# Patient Record
Sex: Female | Born: 1975 | Race: Black or African American | Hispanic: No | Marital: Single | Smoking: Never smoker
Health system: Southern US, Community
[De-identification: ages and names within clinical notes are randomized; demographics above are authoritative.]

## PROBLEM LIST (undated history)

## (undated) DIAGNOSIS — E079 Disorder of thyroid, unspecified: Secondary | ICD-10-CM

---

## 2009-07-06 ENCOUNTER — Emergency Department (HOSPITAL_BASED_OUTPATIENT_CLINIC_OR_DEPARTMENT_OTHER): Admission: EM | Admit: 2009-07-06 | Discharge: 2009-07-06 | Payer: Self-pay | Admitting: Emergency Medicine

## 2009-10-26 ENCOUNTER — Emergency Department (HOSPITAL_BASED_OUTPATIENT_CLINIC_OR_DEPARTMENT_OTHER): Admission: EM | Admit: 2009-10-26 | Discharge: 2009-10-26 | Payer: Self-pay | Admitting: Emergency Medicine

## 2010-04-01 LAB — DIFFERENTIAL
Lymphocytes Relative: 24 % (ref 12–46)
Monocytes Absolute: 0.4 10*3/uL (ref 0.1–1.0)
Monocytes Relative: 6 % (ref 3–12)
Neutro Abs: 4 10*3/uL (ref 1.7–7.7)

## 2010-04-01 LAB — CBC
MCH: 27.6 pg (ref 26.0–34.0)
MCHC: 32.7 g/dL (ref 30.0–36.0)
MCV: 84.4 fL (ref 78.0–100.0)
Platelets: 289 10*3/uL (ref 150–400)
RDW: 13.9 % (ref 11.5–15.5)
WBC: 6.1 10*3/uL (ref 4.0–10.5)

## 2010-04-01 LAB — URINALYSIS, ROUTINE W REFLEX MICROSCOPIC
Hgb urine dipstick: NEGATIVE
Protein, ur: NEGATIVE mg/dL
Urobilinogen, UA: 0.2 mg/dL (ref 0.0–1.0)

## 2010-04-01 LAB — COMPREHENSIVE METABOLIC PANEL
Albumin: 4 g/dL (ref 3.5–5.2)
BUN: 8 mg/dL (ref 6–23)
Calcium: 9.3 mg/dL (ref 8.4–10.5)
Creatinine, Ser: 0.7 mg/dL (ref 0.4–1.2)
Total Protein: 8.7 g/dL — ABNORMAL HIGH (ref 6.0–8.3)

## 2012-04-13 ENCOUNTER — Other Ambulatory Visit (HOSPITAL_COMMUNITY): Payer: Self-pay | Admitting: Internal Medicine

## 2012-04-13 DIAGNOSIS — C73 Malignant neoplasm of thyroid gland: Secondary | ICD-10-CM

## 2012-04-27 ENCOUNTER — Encounter (HOSPITAL_COMMUNITY)
Admission: RE | Admit: 2012-04-27 | Discharge: 2012-04-27 | Disposition: A | Discharge status: Home / Self Care | Payer: Medicaid Other | Source: Ambulatory Visit | Attending: Internal Medicine | Admitting: Internal Medicine

## 2012-04-27 DIAGNOSIS — C73 Malignant neoplasm of thyroid gland: Secondary | ICD-10-CM

## 2012-04-27 MED ORDER — THYROTROPIN ALFA 1.1 MG IM SOLR
0.9000 mg | INTRAMUSCULAR | Status: DC
  Administered 2012-04-27: 0.9 mg via INTRAMUSCULAR

## 2012-04-28 ENCOUNTER — Encounter (HOSPITAL_COMMUNITY): Payer: Medicaid Other | Attending: Internal Medicine

## 2012-04-28 DIAGNOSIS — C73 Malignant neoplasm of thyroid gland: Secondary | ICD-10-CM | POA: Insufficient documentation

## 2012-04-28 MED ORDER — THYROTROPIN ALFA 1.1 MG IM SOLR
0.9000 mg | INTRAMUSCULAR | Status: AC
  Administered 2012-04-28: 0.9 mg via INTRAMUSCULAR

## 2012-04-29 ENCOUNTER — Encounter (HOSPITAL_COMMUNITY)
Admission: RE | Admit: 2012-04-29 | Discharge: 2012-04-29 | Disposition: A | Discharge status: Home / Self Care | Payer: Medicaid Other | Source: Ambulatory Visit | Attending: Internal Medicine | Admitting: Internal Medicine

## 2012-04-29 DIAGNOSIS — C73 Malignant neoplasm of thyroid gland: Secondary | ICD-10-CM | POA: Insufficient documentation

## 2012-05-08 ENCOUNTER — Encounter (HOSPITAL_COMMUNITY)
Admission: RE | Admit: 2012-05-08 | Discharge: 2012-05-08 | Disposition: A | Discharge status: Home / Self Care | Payer: Medicaid Other | Source: Ambulatory Visit | Attending: Internal Medicine | Admitting: Internal Medicine

## 2012-05-08 DIAGNOSIS — C73 Malignant neoplasm of thyroid gland: Secondary | ICD-10-CM | POA: Insufficient documentation

## 2013-05-18 ENCOUNTER — Other Ambulatory Visit (HOSPITAL_COMMUNITY): Payer: Self-pay | Admitting: Internal Medicine

## 2013-05-18 DIAGNOSIS — C73 Malignant neoplasm of thyroid gland: Secondary | ICD-10-CM

## 2013-05-31 ENCOUNTER — Encounter (HOSPITAL_COMMUNITY): Admission: RE | Admit: 2013-05-31 | Payer: Medicaid Other | Source: Ambulatory Visit

## 2013-05-31 ENCOUNTER — Encounter (HOSPITAL_COMMUNITY)
Admission: RE | Admit: 2013-05-31 | Discharge: 2013-05-31 | Disposition: A | Discharge status: Home / Self Care | Payer: Medicaid Other | Source: Ambulatory Visit | Attending: Internal Medicine | Admitting: Internal Medicine

## 2013-05-31 DIAGNOSIS — C73 Malignant neoplasm of thyroid gland: Secondary | ICD-10-CM

## 2013-05-31 MED ORDER — THYROTROPIN ALFA 1.1 MG IM SOLR
0.9000 mg | INTRAMUSCULAR | Status: AC
  Administered 2013-05-31: 0.9 mg via INTRAMUSCULAR
  Filled 2013-05-31: qty 0.9

## 2013-06-01 ENCOUNTER — Encounter (HOSPITAL_COMMUNITY)
Admission: RE | Admit: 2013-06-01 | Discharge: 2013-06-01 | Disposition: A | Discharge status: Home / Self Care | Payer: Medicaid Other | Source: Ambulatory Visit | Attending: Internal Medicine | Admitting: Internal Medicine

## 2013-06-01 ENCOUNTER — Encounter (HOSPITAL_COMMUNITY): Payer: Medicaid Other

## 2013-06-01 DIAGNOSIS — C73 Malignant neoplasm of thyroid gland: Secondary | ICD-10-CM | POA: Insufficient documentation

## 2013-06-01 MED ORDER — THYROTROPIN ALFA 1.1 MG IM SOLR
0.9000 mg | INTRAMUSCULAR | Status: AC
  Administered 2013-06-01: 0.9 mg via INTRAMUSCULAR
  Filled 2013-06-01: qty 0.9

## 2013-06-02 ENCOUNTER — Encounter (HOSPITAL_COMMUNITY)
Admission: RE | Admit: 2013-06-02 | Discharge: 2013-06-02 | Disposition: A | Discharge status: Home / Self Care | Payer: Medicaid Other | Source: Ambulatory Visit | Attending: Internal Medicine | Admitting: Internal Medicine

## 2013-06-02 ENCOUNTER — Encounter (HOSPITAL_COMMUNITY): Payer: Medicaid Other

## 2013-06-02 LAB — HCG, SERUM, QUALITATIVE: Preg, Serum: NEGATIVE

## 2013-06-04 ENCOUNTER — Encounter (HOSPITAL_COMMUNITY): Payer: Medicaid Other

## 2013-06-04 ENCOUNTER — Encounter (HOSPITAL_COMMUNITY)
Admission: RE | Admit: 2013-06-04 | Discharge: 2013-06-04 | Disposition: A | Discharge status: Home / Self Care | Payer: Medicaid Other | Source: Ambulatory Visit | Attending: Internal Medicine | Admitting: Internal Medicine

## 2013-06-04 MED ORDER — SODIUM IODIDE I 131 CAPSULE
4.2000 | Freq: Once | INTRAVENOUS | Status: AC | PRN
  Administered 2013-06-04: 4.2 via ORAL

## 2013-06-08 LAB — THYROGLOBULIN LEVEL: Thyroglobulin: 1 ng/mL (ref 0.0–55.0)

## 2013-06-08 LAB — THYROGLOBULIN ANTIBODY: Thyroglobulin Ab: <20 IU/mL (ref <40.0)

## 2013-06-21 ENCOUNTER — Ambulatory Visit (HOSPITAL_COMMUNITY): Payer: Medicaid Other

## 2013-06-22 ENCOUNTER — Ambulatory Visit (HOSPITAL_COMMUNITY): Payer: Medicaid Other

## 2013-06-23 ENCOUNTER — Ambulatory Visit (HOSPITAL_COMMUNITY): Payer: Medicaid Other

## 2013-06-25 ENCOUNTER — Encounter (HOSPITAL_COMMUNITY): Payer: Medicaid Other

## 2022-07-21 ENCOUNTER — Ambulatory Visit
Admission: EM | Admit: 2022-07-21 | Discharge: 2022-07-21 | Disposition: A | Discharge status: Home / Self Care | Payer: Medicaid Other | Attending: Family Medicine | Admitting: Family Medicine

## 2022-07-21 ENCOUNTER — Encounter: Payer: Self-pay | Admitting: Emergency Medicine

## 2022-07-21 DIAGNOSIS — R5383 Other fatigue: Secondary | ICD-10-CM | POA: Diagnosis not present

## 2022-07-21 DIAGNOSIS — R110 Nausea: Secondary | ICD-10-CM | POA: Diagnosis not present

## 2022-07-21 DIAGNOSIS — E039 Hypothyroidism, unspecified: Secondary | ICD-10-CM | POA: Diagnosis not present

## 2022-07-21 HISTORY — DX: Disorder of thyroid, unspecified: E07.9

## 2022-07-21 MED ORDER — ONDANSETRON HCL 8 MG PO TABS: 8.0000 mg | ORAL_TABLET | Freq: Three times a day (TID) | ORAL | 0 refills | Status: DC | PRN

## 2022-07-21 MED ORDER — ONDANSETRON 4 MG PO TBDP
4.0000 mg | ORAL_TABLET | Freq: Once | ORAL | Status: AC
  Administered 2022-07-21: 4 mg via ORAL

## 2022-07-21 MED ORDER — PROMETHAZINE HCL 25 MG PO TABS: 25.0000 mg | ORAL_TABLET | Freq: Four times a day (QID) | ORAL | 0 refills | Status: AC | PRN

## 2022-07-21 NOTE — ED Provider Notes (Signed)
[facility]    CSN: [id number] Arrival date & time: 07/21/22  1237      History   Chief Complaint Chief Complaint  Patient presents with   Fatigue    HPI Shelby Atkins is a 47 y.o. female.   HPI  Patient has a history of hypothyroidism.  She takes Synthroid 125 mcg a day.  She states that she usually sees her endocrinologist once a year.  She states she has been under a lot of stress.  She does not have a car and has been having to do more walking.  She states that her weight has been going up and down.  She has had nausea for the last 3 days.  She is here today hoping for management of her nausea.  She is going to call her endocrinologist tomorrow to see about an appointment for her thyroid.  She states that sometimes takes time to get an appointment.  I offered to draw blood work so she believes to have an idea if thyroid problems are causing her fatigue Last menstrual period was less than a month ago.  Patient states she is not pregnant Past Medical History:  Diagnosis Date   Thyroid disease     There are no problems to display for this patient.   History reviewed. No pertinent surgical history.  OB History   No obstetric history on file.      Home Medications    Prior to Admission medications   Medication Sig Start Date End Date Taking? Authorizing Provider  levothyroxine (SYNTHROID) 125 MCG tablet Take 125 mcg by mouth daily before breakfast. 12/24/21  Yes [provider]  metoprolol tartrate (LOPRESSOR) 25 MG tablet Take 25 mg by mouth 2 (two) times daily. 12/03/21  Yes [provider]  ondansetron (ZOFRAN) 8 MG tablet Take 1 tablet (8 mg total) by mouth every 8 (eight) hours as needed. 07/21/22  Yes Eustace Moore, MD    Family History History reviewed. No pertinent family history.  Social History Social History   Tobacco Use   Smoking status: Never   Smokeless tobacco: Never  Vaping Use   Vaping Use: Never used   Substance Use Topics   Drug use: Never     Allergies   Patient has no known allergies.   Review of Systems Review of Systems See HPI  Physical Exam Triage Vital Signs ED Triage Vitals  Enc Vitals Group     BP 07/21/22 1251 (!) 165/97     Pulse Rate 07/21/22 1251 (!) 121     Resp 07/21/22 1251 18     Temp 07/21/22 1251 98.4 F (36.9 C)     Temp Source 07/21/22 1251 Oral     SpO2 07/21/22 1251 98 %     Weight 07/21/22 1252 189 lb 5 oz (85.9 kg)     Height 07/21/22 1252 5' 4.5" (1.638 m)     Head Circumference --      Peak Flow --      Pain Score 07/21/22 1252 0     Pain Loc --      Pain Edu? --      Excl. in GC? --    No data found.  Updated Vital Signs BP (!) 165/97 (BP Location: Right Arm)   Pulse (!) 121   Temp 98.4 F (36.9 C) (Oral)   Resp 18   Ht 5' 4.5" (1.638 m)   Wt 85.9 kg   LMP 06/23/2022 (Exact Date)  SpO2 98%   BMI 31.99 kg/m      Physical Exam Constitutional:      General: She is not in acute distress.    Appearance: She is well-developed.     Comments: Appears anxious about health  HENT:     Head: Normocephalic and atraumatic.  Eyes:     Conjunctiva/sclera: Conjunctivae normal.     Pupils: Pupils are equal, round, and reactive to light.  Cardiovascular:     Rate and Rhythm: Normal rate.  Pulmonary:     Effort: Pulmonary effort is normal. No respiratory distress.  Abdominal:     General: There is no distension.     Palpations: Abdomen is soft.  Musculoskeletal:        General: Normal range of motion.     Cervical back: Normal range of motion.  Skin:    General: Skin is warm and dry.  Neurological:     Mental Status: She is alert.     Deep Tendon Reflexes: Reflexes normal.      UC Treatments / Results  Labs (all labs ordered are listed, but only abnormal results are displayed) Labs Reviewed  T4, FREE  TSH    EKG   Radiology No results found.  Procedures Procedures (including critical care time)  Medications  Ordered in UC Medications  ondansetron (ZOFRAN-ODT) disintegrating tablet 4 mg (4 mg Oral Given 07/21/22 1256)    Initial Impression / Assessment and Plan / UC Course  I have reviewed the triage vital signs and the nursing notes.  Pertinent labs & imaging results that were available during my care of the patient were reviewed by me and considered in my medical decision making (see chart for details).     Final Clinical Impressions(s) / UC Diagnoses   Final diagnoses:  Hypothyroidism, unspecified type  Other fatigue  Nausea without vomiting     Discharge Instructions      Take the zofran as needed nausea The thyroid tests will be available on my chart tomorrow Follow with your endocrinologist    ED Prescriptions     Medication Sig Dispense Auth. Provider   ondansetron (ZOFRAN) 8 MG tablet Take 1 tablet (8 mg total) by mouth every 8 (eight) hours as needed. 20 tablet Eustace Moore, MD      PDMP not reviewed this encounter.   Eustace Moore, MD 07/21/22 1320

## 2022-07-21 NOTE — ED Triage Notes (Signed)
Patient c/o feeling fatigue and nausea x 3 days.  Nausea is worse today.  History of thyroid issues and has an appt w/her physician tomorrow @ 3:30pm.  Denies any nausea meds.

## 2022-07-21 NOTE — Discharge Instructions (Signed)
Take the zofran as needed nausea The thyroid tests will be available on my chart tomorrow Follow with your endocrinologist

## 2022-07-23 LAB — T4, FREE: Free T4: 1.43 ng/dL (ref 0.82–1.77)

## 2022-07-23 LAB — TSH: TSH: 1.12 u[IU]/mL (ref 0.450–4.500)

## 2022-08-31 ENCOUNTER — Encounter: Payer: Self-pay | Admitting: Emergency Medicine

## 2022-08-31 ENCOUNTER — Ambulatory Visit
Admission: EM | Admit: 2022-08-31 | Discharge: 2022-08-31 | Disposition: A | Discharge status: Home / Self Care | Payer: Medicaid Other | Attending: Family Medicine | Admitting: Family Medicine

## 2022-08-31 DIAGNOSIS — U071 COVID-19: Secondary | ICD-10-CM | POA: Diagnosis not present

## 2022-08-31 LAB — POC SARS CORONAVIRUS 2 AG -  ED: SARS Coronavirus 2 Ag: POSITIVE — AB

## 2022-08-31 NOTE — ED Triage Notes (Signed)
Patient c/o cough, body chills, congestion, nasal drainage, sneezing x 3 days.  Sx's are worse at night.  At home COVID test was negative on Wednesday.  Patient has taken Mucinex and Nyquil.

## 2022-08-31 NOTE — ED Provider Notes (Signed)
[facility]    CSN: [id number] Arrival date & time: 08/31/22  0805      History   Chief Complaint Chief Complaint  Patient presents with   Sore Throat    HPI Shelby Atkins is a 47 y.o. female.   Patient started with symptoms on Wednesday.  She has cough and cold symptoms.  She also has had bodyaches and headache.  She had shaking chills but she has not had a fever.  Decreased appetite.  She is taking over-the-counter medicines.  She states that she is not improving.  She did a COVID test when her symptoms started on Wednesday, and it was negative at home.  No one else at home is sick    Past Medical History:  Diagnosis Date   Thyroid disease     There are no problems to display for this patient.   History reviewed. No pertinent surgical history.  OB History   No obstetric history on file.      Home Medications    Prior to Admission medications   Medication Sig Start Date End Date Taking? Authorizing Provider  levothyroxine (SYNTHROID) 125 MCG tablet Take 125 mcg by mouth daily before breakfast. 12/24/21  Yes [provider]  metoprolol tartrate (LOPRESSOR) 25 MG tablet Take 25 mg by mouth 2 (two) times daily. 12/03/21  Yes [provider]  promethazine (PHENERGAN) 25 MG tablet Take 1 tablet (25 mg total) by mouth every 6 (six) hours as needed for nausea or vomiting. 07/21/22   Eustace Moore, MD    Family History History reviewed. No pertinent family history.  Social History Social History   Tobacco Use   Smoking status: Never   Smokeless tobacco: Never  Vaping Use   Vaping status: Never Used  Substance Use Topics   Alcohol use: Never   Drug use: Never     Allergies   Patient has no known allergies.   Review of Systems Review of Systems See HPI  Physical Exam Triage Vital Signs ED Triage Vitals  Encounter Vitals Group     BP 08/31/22 0818 (!) 152/93     Systolic BP Percentile --      Diastolic BP Percentile  --      Pulse Rate 08/31/22 0818 76     Resp 08/31/22 0818 18     Temp 08/31/22 0818 98.1 F (36.7 C)     Temp Source 08/31/22 0818 Oral     SpO2 08/31/22 0818 98 %     Weight 08/31/22 0820 190 lb (86.2 kg)     Height 08/31/22 0820 5\' 4"  (1.626 m)     Head Circumference --      Peak Flow --      Pain Score 08/31/22 0820 7     Pain Loc --      Pain Education --      Exclude from Growth Chart --    No data found.  Updated Vital Signs BP (!) 152/93 (BP Location: Right Arm)   Pulse 76   Temp 98.1 F (36.7 C) (Oral)   Resp 18   Ht 5\' 4"  (1.626 m)   Wt 86.2 kg   LMP 07/29/2022   SpO2 98%   BMI 32.61 kg/m      Physical Exam Constitutional:      General: She is not in acute distress.    Appearance: She is well-developed. She is ill-appearing.  HENT:     Head: Normocephalic and atraumatic.  Right Ear: Tympanic membrane and ear canal normal.     Left Ear: Tympanic membrane and ear canal normal.     Nose: Congestion present. No rhinorrhea.     Mouth/Throat:     Pharynx: No posterior oropharyngeal erythema.  Eyes:     Conjunctiva/sclera: Conjunctivae normal.     Pupils: Pupils are equal, round, and reactive to light.  Cardiovascular:     Rate and Rhythm: Normal rate and regular rhythm.     Heart sounds: Normal heart sounds.  Pulmonary:     Effort: Pulmonary effort is normal. No respiratory distress.     Breath sounds: Rhonchi present.  Abdominal:     General: There is no distension.     Palpations: Abdomen is soft.  Musculoskeletal:        General: Normal range of motion.     Cervical back: Normal range of motion.  Lymphadenopathy:     Cervical: No cervical adenopathy.  Skin:    General: Skin is warm and dry.  Neurological:     Mental Status: She is alert.      UC Treatments / Results  Labs (all labs ordered are listed, but only abnormal results are displayed) Labs Reviewed  POC SARS CORONAVIRUS 2 AG -  ED - Abnormal; Notable for the following  components:      Result Value   SARS Coronavirus 2 Ag Positive (*)    All other components within normal limits    EKG   Radiology No results found.  Procedures Procedures (including critical care time)  Medications Ordered in UC Medications - No data to display  Initial Impression / Assessment and Plan / UC Course  I have reviewed the triage vital signs and the nursing notes.  Pertinent labs & imaging results that were available during my care of the patient were reviewed by me and considered in my medical decision making (see chart for details).     Discussed positive test.  Need for quarantine.  Return to work recommendations Final Clinical Impressions(s) / UC Diagnoses   Final diagnoses:  COVID-19     Discharge Instructions      Make sure you drink lots of water May take over-the-counter cough and cold medicine as needed Take Tylenol and ibuprofen for pain and fever Call for problems   ED Prescriptions   None    PDMP not reviewed this encounter.   Eustace Moore, MD 08/31/22 [phone]

## 2022-08-31 NOTE — Discharge Instructions (Signed)
Make sure you drink lots of water May take over-the-counter cough and cold medicine as needed Take Tylenol and ibuprofen for pain and fever Call for problems

## 2023-01-17 ENCOUNTER — Emergency Department (HOSPITAL_BASED_OUTPATIENT_CLINIC_OR_DEPARTMENT_OTHER): Payer: Medicaid Other

## 2023-01-17 ENCOUNTER — Other Ambulatory Visit: Payer: Self-pay

## 2023-01-17 ENCOUNTER — Encounter (HOSPITAL_BASED_OUTPATIENT_CLINIC_OR_DEPARTMENT_OTHER): Payer: Self-pay

## 2023-01-17 DIAGNOSIS — I10 Essential (primary) hypertension: Secondary | ICD-10-CM | POA: Insufficient documentation

## 2023-01-17 DIAGNOSIS — X58XXXA Exposure to other specified factors, initial encounter: Secondary | ICD-10-CM | POA: Diagnosis not present

## 2023-01-17 DIAGNOSIS — Z79899 Other long term (current) drug therapy: Secondary | ICD-10-CM | POA: Diagnosis not present

## 2023-01-17 DIAGNOSIS — S90421A Blister (nonthermal), right great toe, initial encounter: Secondary | ICD-10-CM | POA: Diagnosis present

## 2023-01-17 DIAGNOSIS — E119 Type 2 diabetes mellitus without complications: Secondary | ICD-10-CM | POA: Insufficient documentation

## 2023-01-17 DIAGNOSIS — Z7984 Long term (current) use of oral hypoglycemic drugs: Secondary | ICD-10-CM | POA: Diagnosis not present

## 2023-01-17 LAB — COMPREHENSIVE METABOLIC PANEL
ALT: 15 U/L (ref 0–44)
AST: 17 U/L (ref 15–41)
Albumin: 3.5 g/dL (ref 3.5–5.0)
Alkaline Phosphatase: 57 U/L (ref 38–126)
Anion gap: 6 (ref 5–15)
BUN: 14 mg/dL (ref 6–20)
CO2: 24 mmol/L (ref 22–32)
Calcium: 9.7 mg/dL (ref 8.9–10.3)
Chloride: 106 mmol/L (ref 98–111)
Creatinine, Ser: 0.72 mg/dL (ref 0.44–1.00)
GFR, Estimated: >60 mL/min (ref >60)
Glucose, Bld: 134 mg/dL — ABNORMAL HIGH (ref 70–99)
Potassium: 3.9 mmol/L (ref 3.5–5.1)
Sodium: 136 mmol/L (ref 135–145)
Total Bilirubin: 0.3 mg/dL (ref 0.0–1.2)
Total Protein: 7.6 g/dL (ref 6.5–8.1)

## 2023-01-17 LAB — CBC WITH DIFFERENTIAL/PLATELET
Abs Immature Granulocytes: 0.04 10*3/uL (ref 0.00–0.07)
Basophils Absolute: 0 10*3/uL (ref 0.0–0.1)
Basophils Relative: 1 %
Eosinophils Absolute: 0.2 10*3/uL (ref 0.0–0.5)
Eosinophils Relative: 4 %
HCT: 38.8 % (ref 36.0–46.0)
Hemoglobin: 12.7 g/dL (ref 12.0–15.0)
Immature Granulocytes: 1 %
Lymphocytes Relative: 19 %
Lymphs Abs: 1.1 10*3/uL (ref 0.7–4.0)
MCH: 29.5 pg (ref 26.0–34.0)
MCHC: 32.7 g/dL (ref 30.0–36.0)
MCV: 90 fL (ref 80.0–100.0)
Monocytes Absolute: 0.5 10*3/uL (ref 0.1–1.0)
Monocytes Relative: 8 %
Neutro Abs: 3.9 10*3/uL (ref 1.7–7.7)
Neutrophils Relative %: 67 %
Platelets: 271 10*3/uL (ref 150–400)
RBC: 4.31 MIL/uL (ref 3.87–5.11)
RDW: 13.2 % (ref 11.5–15.5)
WBC: 5.7 10*3/uL (ref 4.0–10.5)
nRBC: 0 % (ref 0.0–0.2)

## 2023-01-17 NOTE — ED Triage Notes (Addendum)
 Pt c/o right 1st toe pain - appears to have a blister on top but pain on bottom.  Pt is diabetic on Metformin.   Pt last took 800 mg Ibuprofen @ 1830.

## 2023-01-18 ENCOUNTER — Emergency Department (HOSPITAL_BASED_OUTPATIENT_CLINIC_OR_DEPARTMENT_OTHER)
Admission: EM | Admit: 2023-01-18 | Discharge: 2023-01-18 | Disposition: A | Discharge status: Home / Self Care | Payer: Medicaid Other | Attending: Emergency Medicine | Admitting: Emergency Medicine

## 2023-01-18 DIAGNOSIS — S90421A Blister (nonthermal), right great toe, initial encounter: Secondary | ICD-10-CM

## 2023-01-18 MED ORDER — DOXYCYCLINE HYCLATE 100 MG PO CAPS: 100.0000 mg | ORAL_CAPSULE | Freq: Two times a day (BID) | ORAL | 0 refills | Status: AC

## 2023-01-18 NOTE — ED Provider Notes (Signed)
 [place] [facility] Provider Note   CSN: [id number] Arrival date & time: 01/17/23  2229     History  Chief Complaint  Patient presents with   Toe Pain    Shelby Atkins is a 48 y.o. female.  The history is provided by the patient.  Toe Pain  Shelby Atkins is a 48 y.o. female who presents to the Emergency Department complaining of toe blister.  She presents to the emergency department for evaluation of blister to the top of her left toe.  She just noticed this blister.  She was seen at urgent care for pain in the base of her right foot.  Her foot pain started on the 31st but she went to urgent care on January 2.  She was treated with NSAIDs, which significantly improves her pain.  Her pain is completely resolved when she takes ibuprofen.  Today she noticed a blister on the top of her toe, which prompted evaluation.  No fever, chest pain, shortness of breath.  She does have a history of diabetes as well as high blood pressure.  She has been experiencing skin changes to her arms, legs and has been already seen by dermatology with a biopsy.  It was determined that her skin changes were secondary to taking amlodipine and that medication was discontinued.  Overall her rash is stable to improving.       Home Medications Prior to Admission medications   Medication Sig Start Date End Date Taking? Authorizing Provider  doxycycline  (VIBRAMYCIN ) 100 MG capsule Take 1 capsule (100 mg total) by mouth 2 (two) times daily. 01/18/23  Yes Griselda Norris, MD  levothyroxine (SYNTHROID) 125 MCG tablet Take 125 mcg by mouth daily before breakfast. 12/24/21   [provider]  metoprolol tartrate (LOPRESSOR) 25 MG tablet Take 25 mg by mouth 2 (two) times daily. 12/03/21   [provider]  promethazine  (PHENERGAN ) 25 MG tablet Take 1 tablet (25 mg total) by mouth every 6 (six) hours as needed for nausea or vomiting. 07/21/22   Maranda Jamee Jacob, MD       Allergies    Patient has no known allergies.    Review of Systems   Review of Systems  All other systems reviewed and are negative.   Physical Exam Updated Vital Signs BP (!) 148/81   Pulse 93   Temp 97.7 F (36.5 C) (Oral)   Resp 15   Ht 5' 4 (1.626 m)   Wt 89.4 kg   LMP 01/15/2023   SpO2 99%   BMI 33.81 kg/m  Physical Exam Vitals and nursing note reviewed.  Constitutional:      Appearance: She is well-developed.  HENT:     Head: Normocephalic and atraumatic.  Cardiovascular:     Rate and Rhythm: Normal rate and regular rhythm.     Heart sounds: No murmur heard. Pulmonary:     Effort: Pulmonary effort is normal. No respiratory distress.     Breath sounds: Normal breath sounds.  Abdominal:     Palpations: Abdomen is soft.     Tenderness: There is no abdominal tenderness. There is no guarding or rebound.  Musculoskeletal:        General: No tenderness.     Comments: 2+ DP pulses bilaterally.  There is an intact blister to the dorsal surface of the great toe without erythema or tenderness.  There are skin changes to the dorsal feet bilaterally with pink/sloughed skin (pt reports ongoing issue for  several weeks seen by derm).   Skin:    General: Skin is warm and dry.  Neurological:     Mental Status: She is alert and oriented to person, place, and time.  Psychiatric:        Behavior: Behavior normal.     ED Results / Procedures / Treatments   Labs (all labs ordered are listed, but only abnormal results are displayed) Labs Reviewed  COMPREHENSIVE METABOLIC PANEL - Abnormal; Notable for the following components:      Result Value   Glucose, Bld 134 (*)    All other components within normal limits  CBC WITH DIFFERENTIAL/PLATELET    EKG None  Radiology DG Toe Great Right Result Date: 01/17/2023 CLINICAL DATA:  Blister on first toe with pain, initial encounter EXAM: RIGHT GREAT TOE COMPARISON:  None Available. FINDINGS: There is no evidence of fracture or  dislocation. There is no evidence of arthropathy or other focal bone abnormality. Soft tissue swelling is noted along the dorsum of the interphalangeal joint consistent with the given clinical history. IMPRESSION: No acute bony abnormality noted. Electronically Signed   By: Oneil Devonshire M.D.   On: 01/17/2023 23:54    Procedures Procedures    Medications Ordered in ED Medications - No data to display  ED Course/ Medical Decision Making/ A&P                                 Medical Decision Making Amount and/or Complexity of Data Reviewed Labs: ordered. Radiology: ordered.  Risk Prescription drug management.   Patient with history of diabetes here for evaluation of skin changes, blister to the right great toe.  On examination she does have an intact vesicle to the toe that does not appear to be purulent.  There is no surrounding erythema.  She also has additional separate skin changes to the dorsal feet bilaterally that are consistent with a rash that is on other regions of her body.  Suspect that this is likely a friction blister, given her history of diabetes will start on antibiotics as there may be a developing infection.  Given this blister appears to be sterile at this time will not unroofed.  She is already scheduled to follow-up with podiatry in about a week.  Plan to discharge with outpatient follow-up and return precautions.        Final Clinical Impression(s) / ED Diagnoses Final diagnoses:  Blister of great toe of right foot, initial encounter    Rx / DC Orders ED Discharge Orders          Ordered    doxycycline  (VIBRAMYCIN ) 100 MG capsule  2 times daily        01/18/23 0256              Griselda Norris, MD 01/18/23 0600

## 2023-07-26 ENCOUNTER — Emergency Department (HOSPITAL_BASED_OUTPATIENT_CLINIC_OR_DEPARTMENT_OTHER)
Admission: EM | Admit: 2023-07-26 | Discharge: 2023-07-26 | Disposition: A | Discharge status: Home / Self Care | Attending: Emergency Medicine | Admitting: Emergency Medicine

## 2023-07-26 ENCOUNTER — Other Ambulatory Visit: Payer: Self-pay

## 2023-07-26 ENCOUNTER — Encounter (HOSPITAL_BASED_OUTPATIENT_CLINIC_OR_DEPARTMENT_OTHER): Payer: Self-pay | Admitting: Emergency Medicine

## 2023-07-26 DIAGNOSIS — R0602 Shortness of breath: Secondary | ICD-10-CM | POA: Diagnosis present

## 2023-07-26 DIAGNOSIS — R059 Cough, unspecified: Secondary | ICD-10-CM | POA: Insufficient documentation

## 2023-07-26 DIAGNOSIS — J40 Bronchitis, not specified as acute or chronic: Secondary | ICD-10-CM | POA: Diagnosis not present

## 2023-07-26 DIAGNOSIS — E039 Hypothyroidism, unspecified: Secondary | ICD-10-CM | POA: Diagnosis not present

## 2023-07-26 DIAGNOSIS — I10 Essential (primary) hypertension: Secondary | ICD-10-CM | POA: Insufficient documentation

## 2023-07-26 DIAGNOSIS — Z79899 Other long term (current) drug therapy: Secondary | ICD-10-CM | POA: Insufficient documentation

## 2023-07-26 LAB — BASIC METABOLIC PANEL WITH GFR
Anion gap: 9 (ref 5–15)
BUN: 15 mg/dL (ref 6–20)
CO2: 26 mmol/L (ref 22–32)
Calcium: 10 mg/dL (ref 8.9–10.3)
Chloride: 100 mmol/L (ref 98–111)
Creatinine, Ser: 0.83 mg/dL (ref 0.44–1.00)
GFR, Estimated: >60 mL/min (ref >60)
Glucose, Bld: 103 mg/dL — ABNORMAL HIGH (ref 70–99)
Potassium: 4.1 mmol/L (ref 3.5–5.1)
Sodium: 135 mmol/L (ref 135–145)

## 2023-07-26 LAB — CBC WITH DIFFERENTIAL/PLATELET
Abs Immature Granulocytes: 0.01 K/uL (ref 0.00–0.07)
Basophils Absolute: 0 K/uL (ref 0.0–0.1)
Basophils Relative: 1 %
Eosinophils Absolute: 0.2 K/uL (ref 0.0–0.5)
Eosinophils Relative: 5 %
HCT: 37.2 % (ref 36.0–46.0)
Hemoglobin: 12.4 g/dL (ref 12.0–15.0)
Immature Granulocytes: 0 %
Lymphocytes Relative: 29 %
Lymphs Abs: 1.4 K/uL (ref 0.7–4.0)
MCH: 28.9 pg (ref 26.0–34.0)
MCHC: 33.3 g/dL (ref 30.0–36.0)
MCV: 86.7 fL (ref 80.0–100.0)
Monocytes Absolute: 0.3 K/uL (ref 0.1–1.0)
Monocytes Relative: 7 %
Neutro Abs: 2.8 K/uL (ref 1.7–7.7)
Neutrophils Relative %: 58 %
Platelets: 213 K/uL (ref 150–400)
RBC: 4.29 MIL/uL (ref 3.87–5.11)
RDW: 13.5 % (ref 11.5–15.5)
WBC: 4.8 K/uL (ref 4.0–10.5)
nRBC: 0 % (ref 0.0–0.2)

## 2023-07-26 LAB — RESP PANEL BY RT-PCR (RSV, FLU A&B, COVID)  RVPGX2
Influenza A by PCR: NEGATIVE
Influenza B by PCR: NEGATIVE
Resp Syncytial Virus by PCR: NEGATIVE
SARS Coronavirus 2 by RT PCR: NEGATIVE

## 2023-07-26 LAB — TROPONIN T, HIGH SENSITIVITY: Troponin T High Sensitivity: <15 ng/L (ref <19)

## 2023-07-26 MED ORDER — DM-GUAIFENESIN ER 30-600 MG PO TB12: 1.0000 | ORAL_TABLET | Freq: Two times a day (BID) | ORAL | 0 refills | Status: AC

## 2023-07-26 MED ORDER — GUAIFENESIN-DM 100-10 MG/5ML PO SYRP
15.0000 mL | ORAL_SOLUTION | Freq: Once | ORAL | Status: AC
  Administered 2023-07-26: 15 mL via ORAL
  Filled 2023-07-26: qty 15

## 2023-07-26 MED ORDER — PREDNISONE 10 MG (21) PO TBPK: ORAL_TABLET | Freq: Every day | ORAL | 0 refills | Status: DC

## 2023-07-26 MED ORDER — DM-GUAIFENESIN ER 30-600 MG PO TB12: 1.0000 | ORAL_TABLET | Freq: Once | ORAL | Status: DC

## 2023-07-26 MED ORDER — PREDNISONE 10 MG (21) PO TBPK
ORAL_TABLET | Freq: Every day | ORAL | 0 refills | Status: AC
Start: 2023-07-26 — End: ?

## 2023-07-26 MED ORDER — DM-GUAIFENESIN ER 30-600 MG PO TB12: 1.0000 | ORAL_TABLET | Freq: Two times a day (BID) | ORAL | 0 refills | Status: DC

## 2023-07-26 NOTE — ED Provider Notes (Signed)
 [place] [facility] Provider Note   CSN: [id number] Arrival date & time: 07/26/23  1723     Patient presents with: Shortness of Breath   Shelby Atkins is a 48 y.o. female with past medical history of hypothyroidism, HTN presents to emergency department for evaluation of intermittent shortness of breath, nasal congestion, nonproductive cough, wheezing that started 3 days ago.  Reports shortness of breath is intermittent and worse at night.  Does not feel short of breath at rest just feels that she is breathing fast.  She sought ED evaluation with normal cardiac workup and negative chest x-ray for pneumonia effusion.,  Recommended for cardiology follow-up.   Today, sought ED evaluation as she heard wheezing.  Reports this feels similar to bronchitis spells in the past.  Has inhaler from previous bronchitis flare without relief to shortness of breath.  Has not tried any other OTC medications at home.  Denies recent immobilization, surgery, travel, pedal edema, prior history of blood clot, OCP, hemoptysis  {Add pertinent medical, surgical, social history, OB history to HPI:32947}  Shortness of Breath      Prior to Admission medications   Medication Sig Start Date End Date Taking? Authorizing Provider  doxycycline  (VIBRAMYCIN ) 100 MG capsule Take 1 capsule (100 mg total) by mouth 2 (two) times daily. 01/18/23   Griselda Norris, MD  levothyroxine (SYNTHROID) 125 MCG tablet Take 125 mcg by mouth daily before breakfast. 12/24/21   [provider]  metoprolol tartrate (LOPRESSOR) 25 MG tablet Take 25 mg by mouth 2 (two) times daily. 12/03/21   [provider]  promethazine  (PHENERGAN ) 25 MG tablet Take 1 tablet (25 mg total) by mouth every 6 (six) hours as needed for nausea or vomiting. 07/21/22   Maranda Jamee Jacob, MD    Allergies: Patient has no known allergies.    Review of Systems  Respiratory:  Positive for shortness of breath.      Updated Vital Signs BP (!) 152/88 (BP Location: Right Arm)   Pulse 86   Temp 98.1 F (36.7 C)   Resp 16   Ht 5' 4.5 (1.638 m)   Wt 90.7 kg   SpO2 99%   BMI 33.80 kg/m   Physical Exam Vitals and nursing note reviewed.  Constitutional:      General: She is not in acute distress.    Appearance: Normal appearance. She is not ill-appearing.  HENT:     Head: Normocephalic and atraumatic.     Right Ear: Tympanic membrane, ear canal and external ear normal.     Left Ear: Tympanic membrane, ear canal and external ear normal.     Nose: Congestion present.     Right Sinus: No maxillary sinus tenderness or frontal sinus tenderness.     Left Sinus: No maxillary sinus tenderness or frontal sinus tenderness.     Comments: No submandibular swelling nor TTP.  Maintaining secretions without difficulty.  Swallows without difficulty.    Mouth/Throat:     Mouth: Mucous membranes are moist.     Pharynx: No oropharyngeal exudate or posterior oropharyngeal erythema.  Eyes:     Conjunctiva/sclera: Conjunctivae normal.  Cardiovascular:     Rate and Rhythm: Normal rate.  Pulmonary:     Effort: Pulmonary effort is normal. No respiratory distress.     Breath sounds: Normal breath sounds.     Comments: No signs of respiratory distress.  Speaking in full complete sentences without difficulty. Musculoskeletal:     Right lower leg:  No tenderness. No edema.     Left lower leg: No tenderness. No edema.  Skin:    General: Skin is warm.     Capillary Refill: Capillary refill takes less than 2 seconds.     Coloration: Skin is not jaundiced or pale.  Neurological:     Mental Status: She is alert and oriented to person, place, and time. Mental status is at baseline.     (all labs ordered are listed, but only abnormal results are displayed) Labs Reviewed  RESP PANEL BY RT-PCR (RSV, FLU A&B, COVID)  [id number]    EKG: None  Radiology: No results found.  {Document cardiac monitor, telemetry  assessment procedure when appropriate:32947} Procedures   Medications Ordered in the ED - No data to display    {Click here for ABCD2, HEART and other calculators REFRESH Note before signing:1}                              Medical Decision Making Amount and/or Complexity of Data Reviewed Labs: ordered.  Risk OTC drugs.   Patient presents to the ED for concern of congestion, cough, SHOB, wheezing, this involves an extensive number of treatment options, and is a complaint that carries with it a high risk of complications and morbidity.  The differential diagnosis includes COVID, flu, RSV, pneumonia, bronchitis, viral URI, sinusitis, foreign body aspiration, asthma exacerbation.  Not exhaustive list   Co morbidities that complicate the patient evaluation  None   Additional history obtained:  Additional history obtained from Nursing and Outside Medical Records   External records from outside source obtained and reviewed including triage RN note   Lab Tests:  I Ordered, and personally interpreted labs.  The pertinent results include:  ***    Cardiac Monitoring:  The patient was maintained on a cardiac monitor.  I personally viewed and interpreted the cardiac monitored which showed an underlying rhythm of: ***   Medicines ordered and prescription drug management:  I ordered medication including mucinex  DM  for ***  Reevaluation of the patient after these medicines showed that the patient {resolved/improved/worsened:23923::improved} I have reviewed the patients home medicines and have made adjustments as needed     Problem List / ED Course:  SHOB congestion   Reevaluation:  After the interventions noted above, I reevaluated the patient and found that they have :{resolved/improved/worsened:23923::improved}   Social Determinants of Health:  Has cardiology apportionment tuesday   Dispostion:  After consideration of the diagnostic results and the patients  response to treatment, I feel that the patent would benefit from outpatient management with PCP f/u.    {Document critical care time when appropriate  Document review of labs and clinical decision tools ie [id number], etc  Document your independent review of radiology images and any outside records  Document your discussion with family members, caretakers and with consultants  Document social determinants of health affecting pt's care  Document your decision making why or why not admission, treatments were needed:32947:::1}   Final diagnoses:  None    ED Discharge Orders     None

## 2023-07-26 NOTE — ED Notes (Signed)
 Pt ambulated in dept on room air, SATS 97-100% HR steady at 87. No distress noted

## 2023-07-26 NOTE — Discharge Instructions (Addendum)
 Thank you for letting us  evaluate you today.  Your lung sounds are clear to auscultation.  I have low suspicion that you are fluid overloaded or having pneumonia.  Your lab work is notable for no elevated white blood cell count to indicate inflammation action..  Your electrolytes are within normal limits. Think your symptoms are related to bronchitis. Provided you with steroid taper, mucinex  DM for mucous, cough. Please use humidifier, cough drops, maintain hydration  Return to ED if you experience chest pain, shortness of breath

## 2023-07-26 NOTE — ED Triage Notes (Signed)
 Pt c/o SHOB and wheezing, no hx of asthma; NAD, RT in to assess
